# Patient Record
Sex: Female | Born: 1965 | Race: Black or African American | Hispanic: No | Marital: Married | Smoking: Former smoker
Health system: Southern US, Community
[De-identification: ages and names within clinical notes are randomized; demographics above are authoritative.]

## PROBLEM LIST (undated history)

## (undated) DIAGNOSIS — L0390 Cellulitis, unspecified: Secondary | ICD-10-CM

## (undated) HISTORY — PX: CERVICAL CONIZATION W/BX: SHX1330

## (undated) HISTORY — PX: ABDOMINAL HYSTERECTOMY: SHX81

---

## 2014-01-19 ENCOUNTER — Inpatient Hospital Stay (HOSPITAL_COMMUNITY)
Admission: EM | Admit: 2014-01-19 | Discharge: 2014-01-21 | DRG: 603 | Disposition: A | Discharge status: Home / Self Care | Payer: Self-pay | Attending: Internal Medicine | Admitting: Internal Medicine

## 2014-01-19 ENCOUNTER — Encounter (HOSPITAL_COMMUNITY): Payer: Self-pay | Admitting: Emergency Medicine

## 2014-01-19 DIAGNOSIS — L03119 Cellulitis of unspecified part of limb: Principal | ICD-10-CM

## 2014-01-19 DIAGNOSIS — Z87891 Personal history of nicotine dependence: Secondary | ICD-10-CM

## 2014-01-19 DIAGNOSIS — L02419 Cutaneous abscess of limb, unspecified: Principal | ICD-10-CM | POA: Diagnosis present

## 2014-01-19 DIAGNOSIS — Z8249 Family history of ischemic heart disease and other diseases of the circulatory system: Secondary | ICD-10-CM

## 2014-01-19 DIAGNOSIS — L0390 Cellulitis, unspecified: Secondary | ICD-10-CM | POA: Diagnosis present

## 2014-01-19 HISTORY — DX: Cellulitis, unspecified: L03.90

## 2014-01-19 LAB — CBC WITH DIFFERENTIAL/PLATELET
BASOS ABS: 0 10*3/uL (ref 0.0–0.1)
Basophils Relative: 0 % (ref 0–1)
EOS PCT: 2 % (ref 0–5)
Eosinophils Absolute: 0.2 10*3/uL (ref 0.0–0.7)
HEMATOCRIT: 38.1 % (ref 36.0–46.0)
HEMOGLOBIN: 12.5 g/dL (ref 12.0–15.0)
LYMPHS ABS: 1.5 10*3/uL (ref 0.7–4.0)
LYMPHS PCT: 14 % (ref 12–46)
MCH: 27.6 pg (ref 26.0–34.0)
MCHC: 32.8 g/dL (ref 30.0–36.0)
MCV: 84.1 fL (ref 78.0–100.0)
MONO ABS: 1 10*3/uL (ref 0.1–1.0)
MONOS PCT: 9 % (ref 3–12)
NEUTROS ABS: 8.3 10*3/uL — AB (ref 1.7–7.7)
Neutrophils Relative %: 75 % (ref 43–77)
Platelets: 271 10*3/uL (ref 150–400)
RBC: 4.53 MIL/uL (ref 3.87–5.11)
RDW: 13.9 % (ref 11.5–15.5)
WBC: 10.9 10*3/uL — AB (ref 4.0–10.5)

## 2014-01-19 LAB — COMPREHENSIVE METABOLIC PANEL
ALT: 31 U/L (ref 0–35)
AST: 27 U/L (ref 0–37)
Albumin: 4 g/dL (ref 3.5–5.2)
Alkaline Phosphatase: 90 U/L (ref 39–117)
BILIRUBIN TOTAL: 0.5 mg/dL (ref 0.3–1.2)
BUN: 8 mg/dL (ref 6–23)
CALCIUM: 9.4 mg/dL (ref 8.4–10.5)
CHLORIDE: 96 meq/L (ref 96–112)
CO2: 26 meq/L (ref 19–32)
Creatinine, Ser: 1 mg/dL (ref 0.50–1.10)
GFR calc Af Amer: 76 mL/min — ABNORMAL LOW (ref >90)
GFR, EST NON AFRICAN AMERICAN: 66 mL/min — AB (ref >90)
GLUCOSE: 130 mg/dL — AB (ref 70–99)
Potassium: 4.2 mEq/L (ref 3.7–5.3)
Sodium: 136 mEq/L — ABNORMAL LOW (ref 137–147)
Total Protein: 8.2 g/dL (ref 6.0–8.3)

## 2014-01-19 MED ORDER — SODIUM CHLORIDE 0.9 % IV BOLUS (SEPSIS)
1000.0000 mL | Freq: Once | INTRAVENOUS | Status: AC
  Administered 2014-01-19: 1000 mL via INTRAVENOUS

## 2014-01-19 MED ORDER — VANCOMYCIN HCL IN DEXTROSE 1-5 GM/200ML-% IV SOLN
1000.0000 mg | Freq: Once | INTRAVENOUS | Status: AC
  Administered 2014-01-19: 1000 mg via INTRAVENOUS
  Filled 2014-01-19: qty 200

## 2014-01-19 MED ORDER — OXYCODONE-ACETAMINOPHEN 5-325 MG PO TABS
1.0000 | ORAL_TABLET | Freq: Once | ORAL | Status: AC
  Administered 2014-01-19: 1 via ORAL
  Filled 2014-01-19: qty 1

## 2014-01-19 NOTE — ED Notes (Signed)
Had fever and chills yesterday.  Noticed swelling and redness of bilateral LE this morning.  Has had cellulitis before and states this is how it presented.Marland Kitchen

## 2014-01-19 NOTE — ED Provider Notes (Signed)
CSN: [id number]     Arrival date & time 01/19/14  1847 History  This chart was scribed for Benny Lennert, MD by Danella Maiers, ED Scribe. This patient was seen in room APA19/APA19 and the patient's care was started at 8:56 PM.    Chief Complaint  Patient presents with  . Leg Swelling  . Leg Pain   Patient is a 48 y.o. female presenting with leg pain. The history is provided by the patient. No language interpreter was used.  Leg Pain Location:  Leg Time since incident:  12 hours Leg location:  L lower leg and R lower leg Chronicity:  Recurrent Relieved by:  Nothing Worsened by:  Nothing tried Ineffective treatments:  None tried Associated symptoms: fever, itching and swelling   Associated symptoms: no back pain and no fatigue    HPI Comments: Bonnie Hayden is a 48 y.o. female who presents to the Emergency Department complaining of pain, itching, swelling, and redness to bilateral lower legs onset this morning after 2 days of fevers and chills. She has a h/o cellulitis that looked the same and occurred on both legs, 2 years ago and 6 years ago.  Past Medical History  Diagnosis Date  . Cellulitis    Past Surgical History  Procedure Laterality Date  . Cervical conization w/bx    . Cesarean section      x2  . Abdominal hysterectomy      partial   History reviewed. No pertinent family history. History  Substance Use Topics  . Smoking status: Former Games developer  . Smokeless tobacco: Not on file  . Alcohol Use: No   OB History   Grav Para Term Preterm Abortions TAB SAB Ect Mult Living                 Review of Systems  Constitutional: Positive for fever and chills. Negative for appetite change and fatigue.  HENT: Negative for congestion, ear discharge and sinus pressure.   Eyes: Negative for discharge.  Respiratory: Negative for cough.   Cardiovascular: Positive for leg swelling. Negative for chest pain.  Gastrointestinal: Negative for abdominal pain and diarrhea.   Genitourinary: Negative for frequency and hematuria.  Musculoskeletal: Negative for back pain.  Skin: Positive for color change, itching and rash.  Neurological: Negative for seizures and headaches.  Psychiatric/Behavioral: Negative for hallucinations.      Allergies  Review of patient's allergies indicates not on file.  Home Medications   Prior to Admission medications   Not on File   BP 135/76  Pulse 108  Temp(Src) 100 F (37.8 C) (Oral)  Resp 18  Ht 5' 5.5" (1.664 m)  Wt 255 lb (115.667 kg)  BMI 41.77 kg/m2  SpO2 100% Physical Exam  Constitutional: She is oriented to person, place, and time. She appears well-developed.  HENT:  Head: Normocephalic.  Eyes: Conjunctivae and EOM are normal. No scleral icterus.  Neck: Neck supple. No thyromegaly present.  Cardiovascular: Normal rate and regular rhythm.  Exam reveals no gallop and no friction rub.   No murmur heard. Pulmonary/Chest: No stridor. She has no wheezes. She has no rales. She exhibits no tenderness.  Abdominal: She exhibits no distension. There is no tenderness. There is no rebound.  Musculoskeletal: Normal range of motion. She exhibits no edema.  Swelling to BLE with rash and tenderness  Lymphadenopathy:    She has no cervical adenopathy.  Neurological: She is oriented to person, place, and time. She exhibits normal muscle tone. Coordination normal.  Skin: No rash noted. No erythema.  Psychiatric: She has a normal mood and affect. Her behavior is normal.    ED Course  Procedures (including critical care time) Medications  sodium chloride 0.9 % bolus 1,000 mL (not administered)  vancomycin (VANCOCIN) IVPB 1000 mg/200 mL premix (not administered)    DIAGNOSTIC STUDIES: Oxygen Saturation is 100% on RA, normal by my interpretation.    COORDINATION OF CARE: 9:01 PM- Discussed treatment plan with pt which includes basic labs. Will give IV fluids and vancomycin. Pt agrees to plan.    Labs Review Labs  Reviewed  CULTURE, BLOOD (ROUTINE X 2)  CULTURE, BLOOD (ROUTINE X 2)  CBC WITH DIFFERENTIAL  COMPREHENSIVE METABOLIC PANEL    Imaging Review No results found.   EKG Interpretation None      MDM   Final diagnoses:  None   The chart was scribed for me under my direct supervision.  I personally performed the history, physical, and medical decision making and all procedures in the evaluation of this patient.Benny Lennert.   Breniyah Romm L Mirha Brucato, MD 01/19/14 [phone]

## 2014-01-20 ENCOUNTER — Inpatient Hospital Stay (HOSPITAL_COMMUNITY): Payer: Self-pay

## 2014-01-20 DIAGNOSIS — L0291 Cutaneous abscess, unspecified: Secondary | ICD-10-CM

## 2014-01-20 DIAGNOSIS — L0390 Cellulitis, unspecified: Secondary | ICD-10-CM

## 2014-01-20 LAB — COMPREHENSIVE METABOLIC PANEL
ALT: 26 U/L (ref 0–35)
AST: 22 U/L (ref 0–37)
Albumin: 3.2 g/dL — ABNORMAL LOW (ref 3.5–5.2)
Alkaline Phosphatase: 73 U/L (ref 39–117)
BILIRUBIN TOTAL: 0.5 mg/dL (ref 0.3–1.2)
BUN: 7 mg/dL (ref 6–23)
CHLORIDE: 101 meq/L (ref 96–112)
CO2: 26 mEq/L (ref 19–32)
Calcium: 8.5 mg/dL (ref 8.4–10.5)
Creatinine, Ser: 0.99 mg/dL (ref 0.50–1.10)
GFR, EST AFRICAN AMERICAN: 77 mL/min — AB (ref >90)
GFR, EST NON AFRICAN AMERICAN: 66 mL/min — AB (ref >90)
GLUCOSE: 103 mg/dL — AB (ref 70–99)
Potassium: 4 mEq/L (ref 3.7–5.3)
SODIUM: 137 meq/L (ref 137–147)
Total Protein: 6.9 g/dL (ref 6.0–8.3)

## 2014-01-20 LAB — CBC
HCT: 34.7 % — ABNORMAL LOW (ref 36.0–46.0)
Hemoglobin: 11.4 g/dL — ABNORMAL LOW (ref 12.0–15.0)
MCH: 27.7 pg (ref 26.0–34.0)
MCHC: 32.9 g/dL (ref 30.0–36.0)
MCV: 84.4 fL (ref 78.0–100.0)
PLATELETS: 272 10*3/uL (ref 150–400)
RBC: 4.11 MIL/uL (ref 3.87–5.11)
RDW: 14 % (ref 11.5–15.5)
WBC: 8.7 10*3/uL (ref 4.0–10.5)

## 2014-01-20 MED ORDER — ACETAMINOPHEN 325 MG PO TABS
650.0000 mg | ORAL_TABLET | Freq: Four times a day (QID) | ORAL | Status: DC | PRN
  Administered 2014-01-20 – 2014-01-21 (×4): 650 mg via ORAL
  Filled 2014-01-20 (×4): qty 2

## 2014-01-20 MED ORDER — HYDROXYZINE HCL 25 MG PO TABS
25.0000 mg | ORAL_TABLET | Freq: Three times a day (TID) | ORAL | Status: DC | PRN
  Administered 2014-01-20: 25 mg via ORAL
  Filled 2014-01-20: qty 1

## 2014-01-20 MED ORDER — VANCOMYCIN HCL IN DEXTROSE 1-5 GM/200ML-% IV SOLN
1000.0000 mg | Freq: Two times a day (BID) | INTRAVENOUS | Status: DC
  Administered 2014-01-20 – 2014-01-21 (×3): 1000 mg via INTRAVENOUS
  Filled 2014-01-20 (×5): qty 200

## 2014-01-20 MED ORDER — VANCOMYCIN HCL 10 G IV SOLR
1500.0000 mg | Freq: Once | INTRAVENOUS | Status: AC
  Administered 2014-01-20: 1500 mg via INTRAVENOUS
  Filled 2014-01-20: qty 1500

## 2014-01-20 MED ORDER — ENOXAPARIN SODIUM 40 MG/0.4ML ~~LOC~~ SOLN
40.0000 mg | SUBCUTANEOUS | Status: DC
  Administered 2014-01-20 – 2014-01-21 (×2): 40 mg via SUBCUTANEOUS
  Filled 2014-01-20 (×2): qty 0.4

## 2014-01-20 MED ORDER — ONDANSETRON HCL 4 MG/2ML IJ SOLN: 4.0000 mg | Freq: Four times a day (QID) | INTRAMUSCULAR | Status: DC | PRN

## 2014-01-20 MED ORDER — MORPHINE SULFATE 2 MG/ML IJ SOLN: 2.0000 mg | INTRAMUSCULAR | Status: DC | PRN

## 2014-01-20 MED ORDER — SODIUM CHLORIDE 0.9 % IV SOLN
INTRAVENOUS | Status: DC
  Administered 2014-01-20 – 2014-01-21 (×3): via INTRAVENOUS

## 2014-01-20 MED ORDER — ONDANSETRON HCL 4 MG PO TABS: 4.0000 mg | ORAL_TABLET | Freq: Four times a day (QID) | ORAL | Status: DC | PRN

## 2014-01-20 NOTE — Progress Notes (Signed)
ANTIBIOTIC CONSULT NOTE-Preliminary  Pharmacy Consult for Vancomycin Indication: Cellulitis  Allergies  Allergen Reactions  . Penicillins Other (See Comments)    Unknown reaction  . Tramadol Nausea Only    Patient Measurements: Height: 5' 5.5" (166.4 cm) Weight: 255 lb (115.667 kg) IBW/kg (Calculated) : 58.15   Vital Signs: Temp: 100 F (37.8 C) (06/05 1905) Temp src: Oral (06/05 1905) BP: 135/76 mmHg (06/05 1905) Pulse Rate: 108 (06/05 1905)  Labs:  Recent Labs  01/19/14 2111  WBC 10.9*  HGB 12.5  PLT 271  CREATININE 1.00    Estimated Creatinine Clearance: 88.2 ml/min (by C-G formula based on Cr of 1).  No results found for this basename: VANCOTROUGH, VANCOPEAK, VANCORANDOM, GENTTROUGH, GENTPEAK, GENTRANDOM, TOBRATROUGH, TOBRAPEAK, TOBRARND, AMIKACINPEAK, AMIKACINTROU, AMIKACIN,  in the last 72 hours   Microbiology: No results found for this or any previous visit (from the past 720 hour(s)).  Medical History: Past Medical History  Diagnosis Date  . Cellulitis     Medications:  Vancomycin 1 Gm IV x 1 dose in the ED at 2200 01/19/14  Assessment: 48 yo female with PMH sig for 2 episodes of cellulitis admitted with bilateral leg swelling, redness, itching and 2 day hx of chills and fever. Vancomycin to be started for cellulitis.  Goal of Therapy:  Vancomycin troughs 10-15 mcg/ml  Plan:  Preliminary review of pertinent patient information completed.  Protocol will be initiated with a one-time dose of Vancomycin 1500 mg IV in addition to the 1 GM dose given in the ED.  Jeani Hawking clinical pharmacist will complete review during morning rounds to assess patient and finalize treatment regimen.  Arelia Sneddon, [facility] 01/20/2014,1:29 AM .

## 2014-01-20 NOTE — H&P (Signed)
PCP:   No PCP Per Patient   Chief Complaint:  Leg swelling and redness  HPI:  48 year old female who  has a past medical history of Cellulitis. today presents to the ED with chief complaint of leg pain, swelling and redness of both lower extremities for past 2 days associated with fever and chills, she denies injury or insect bite. She did have cellulitis diagnosed 2 years ago and 6 years ago with similar presentation involving both lower extremities. She also complains of pain involving both lower extremities. She denies nausea vomiting or diarrhea. No chest pain or shortness of breath.   Allergies:   Allergies  Allergen Reactions  . Penicillins Other (See Comments)    Unknown reaction  . Tramadol Nausea Only      Past Medical History  Diagnosis Date  . Cellulitis     Past Surgical History  Procedure Laterality Date  . Cervical conization w/bx    . Cesarean section      x2  . Abdominal hysterectomy      partial    Prior to Admission medications   Medication Sig Start Date End Date Taking? Authorizing Provider  acetaminophen (TYLENOL) 500 MG tablet Take 1,500 mg by mouth once as needed for mild pain or moderate pain.   Yes Historical Provider, MD    Social History:  reports that she has quit smoking. She does not have any smokeless tobacco history on file. She reports that she does not drink alcohol or use illicit drugs.  Patient's father died of MI at the age of 49  All the positives are listed in BOLD  Review of Systems:  HEENT: Headache, blurred vision, runny nose, sore throat Neck: Hypothyroidism, hyperthyroidism,,lymphadenopathy Chest : Shortness of breath, history of COPD, Asthma Heart : Chest pain, history of coronary arterey disease GI:  Nausea, vomiting, diarrhea, constipation, GERD GU: Dysuria, urgency, frequency of urination, hematuria Neuro: Stroke, seizures, syncope Psych: Depression, anxiety, hallucinations   Physical Exam: Blood pressure  135/76, pulse 108, temperature 100 F (37.8 C), temperature source Oral, resp. rate 18, height 5' 5.5" (1.664 m), weight 115.667 kg (255 lb), SpO2 100.00%. Constitutional:   Patient is a well-developed and well-nourished female* in no acute distress and cooperative with exam. Head: Normocephalic and atraumatic Mouth: Mucus membranes moist Eyes: PERRL, EOMI, conjunctivae normal Neck: Supple, No Thyromegaly Cardiovascular: RRR, S1 normal, S2 normal Pulmonary/Chest: CTAB, no wheezes, rales, or rhonchi Abdominal: Soft. Non-tender, non-distended, bowel sounds are normal, no masses, organomegaly, or guarding present.  Neurological: A&O x3, Strenght is normal and symmetric bilaterally, cranial nerve II-XII are grossly intact, no focal motor deficit, sensory intact to light touch bilaterally.  Extremities : Patchy erythema, warmth of both lower extremities, tenderness to palpation  Labs on Admission:  Basic Metabolic Panel:  Recent Labs Lab 01/19/14 2111  NA 136*  K 4.2  CL 96  CO2 26  GLUCOSE 130*  BUN 8  CREATININE 1.00  CALCIUM 9.4   Liver Function Tests:  Recent Labs Lab 01/19/14 2111  AST 27  ALT 31  ALKPHOS 90  BILITOT 0.5  PROT 8.2  ALBUMIN 4.0   No results found for this basename: LIPASE, AMYLASE,  in the last 168 hours No results found for this basename: AMMONIA,  in the last 168 hours CBC:  Recent Labs Lab 01/19/14 2111  WBC 10.9*  NEUTROABS 8.3*  HGB 12.5  HCT 38.1  MCV 84.1  PLT 271       Assessment/Plan Principal Problem:  Cellulitis  Cellulitis We'll admit the patient under observation, and start IV vancomycin. We'll give morphine when necessary for pain\ We'll also obtain bilateral venous duplex to rule out underlying DVT though less likely. Blood cultures x2 have been obtained, if patient clinically improves can be discharged in next 08-20-46 hours on by mouth antibiotics.   Code status: Presumed full code  Family discussion: Discussed with  patient's sister at bedside   Time Spent on Admission: 60 minutes  Meredeth Ide [facility] Pager: [phone] 01/20/2014, 12:12 AM  If 7PM-7AM, please contact night-coverage  [email or web address]  Password [email or web address]

## 2014-01-20 NOTE — Progress Notes (Signed)
ANTIBIOTIC CONSULT NOTE  Pharmacy Consult for Vancomycin Indication: cellulitis  Allergies  Allergen Reactions  . Penicillins Other (See Comments)    Unknown reaction  . Tramadol Nausea Only    Patient Measurements: Height: 5' 5.5" (166.4 cm) Weight: 253 lb 4.8 oz (114.896 kg) IBW/kg (Calculated) : 58.15  Vital Signs: Temp: 99.8 F (37.7 C) (06/06 0604) Temp src: Oral (06/06 0604) BP: 121/70 mmHg (06/06 0604) Pulse Rate: 90 (06/06 0604) Intake/Output from previous day: 06/05 0701 - 06/06 0700 In: 957.5 [P.O.:120; I.V.:337.5; IV Piggyback:500] Out: 700 [Urine:700] Intake/Output from this shift:    Labs:  Recent Labs  01/19/14 2111 01/20/14 0601  WBC 10.9* 8.7  HGB 12.5 11.4*  PLT 271 272  CREATININE 1.00 0.99   Estimated Creatinine Clearance: 88.8 ml/min (by C-G formula based on Cr of 0.99). No results found for this basename: VANCOTROUGH, Leodis Binet, VANCORANDOM, GENTTROUGH, GENTPEAK, GENTRANDOM, TOBRATROUGH, TOBRAPEAK, TOBRARND, AMIKACINPEAK, AMIKACINTROU, AMIKACIN,  in the last 72 hours   Microbiology: Recent Results (from the past 720 hour(s))  CULTURE, BLOOD (ROUTINE X 2)     Status: None   Collection Time    01/19/14  9:11 PM      Result Value Ref Range Status   Specimen Description RIGHT ANTECUBITAL   Final   Special Requests BOTTLES DRAWN AEROBIC AND ANAEROBIC 8CC   Final   Culture NO GROWTH 1 DAY   Final   Report Status PENDING   Incomplete  CULTURE, BLOOD (ROUTINE X 2)     Status: None   Collection Time    01/19/14  9:20 PM      Result Value Ref Range Status   Specimen Description BLOOD LEFT HAND   Final   Special Requests BOTTLES DRAWN AEROBIC AND ANAEROBIC 6CC   Final   Culture NO GROWTH 1 DAY   Final   Report Status PENDING   Incomplete    Anti-infectives   Start     Dose/Rate Route Frequency Ordered Stop   01/20/14 0130  vancomycin (VANCOCIN) 1,500 mg in sodium chloride 0.9 % 500 mL IVPB     1,500 mg 250 mL/hr over 120 Minutes  Intravenous  Once 01/20/14 0127 01/20/14 0421   01/19/14 2115  vancomycin (VANCOCIN) IVPB 1000 mg/200 mL premix     1,000 mg 200 mL/hr over 60 Minutes Intravenous  Once 01/19/14 2101 01/19/14 2320      Assessment: Okay for Protocol, obesity/Normalized CrCl dosing being treated for BLE cellulitis. Estimated normalized CrCl = 85ml/min.  Patient received a total loading dose of 2500mg .  Goal of Therapy:  Vancomycin trough level 10-15 mcg/ml  Plan:  Vancomycin 1000mg  IV every 12 hours. Measure antibiotic drug levels at steady state Follow up culture results  Mady Gemma 01/20/2014,7:39 AM

## 2014-01-20 NOTE — Progress Notes (Signed)
I have seen and assessed patient and agree with Dr Lama's assessment and plan. 

## 2014-01-21 LAB — URINALYSIS, ROUTINE W REFLEX MICROSCOPIC
BILIRUBIN URINE: NEGATIVE
GLUCOSE, UA: NEGATIVE mg/dL
Hgb urine dipstick: NEGATIVE
Ketones, ur: NEGATIVE mg/dL
Leukocytes, UA: NEGATIVE
Nitrite: NEGATIVE
PROTEIN: NEGATIVE mg/dL
Specific Gravity, Urine: >1.03 — ABNORMAL HIGH (ref 1.005–1.030)
UROBILINOGEN UA: 1 mg/dL (ref 0.0–1.0)
pH: 6 (ref 5.0–8.0)

## 2014-01-21 LAB — BASIC METABOLIC PANEL
BUN: 7 mg/dL (ref 6–23)
CALCIUM: 8.8 mg/dL (ref 8.4–10.5)
CO2: 26 meq/L (ref 19–32)
CREATININE: 0.99 mg/dL (ref 0.50–1.10)
Chloride: 104 mEq/L (ref 96–112)
GFR calc Af Amer: 77 mL/min — ABNORMAL LOW (ref >90)
GFR, EST NON AFRICAN AMERICAN: 66 mL/min — AB (ref >90)
GLUCOSE: 72 mg/dL (ref 70–99)
Potassium: 4 mEq/L (ref 3.7–5.3)
Sodium: 140 mEq/L (ref 137–147)

## 2014-01-21 LAB — CBC WITH DIFFERENTIAL/PLATELET
BASOS PCT: 0 % (ref 0–1)
Basophils Absolute: 0 10*3/uL (ref 0.0–0.1)
EOS ABS: 0.2 10*3/uL (ref 0.0–0.7)
Eosinophils Relative: 3 % (ref 0–5)
HCT: 33.3 % — ABNORMAL LOW (ref 36.0–46.0)
HEMOGLOBIN: 10.9 g/dL — AB (ref 12.0–15.0)
LYMPHS ABS: 2.4 10*3/uL (ref 0.7–4.0)
Lymphocytes Relative: 36 % (ref 12–46)
MCH: 27.9 pg (ref 26.0–34.0)
MCHC: 32.7 g/dL (ref 30.0–36.0)
MCV: 85.2 fL (ref 78.0–100.0)
MONOS PCT: 18 % — AB (ref 3–12)
Monocytes Absolute: 1.2 10*3/uL — ABNORMAL HIGH (ref 0.1–1.0)
NEUTROS ABS: 2.8 10*3/uL (ref 1.7–7.7)
Neutrophils Relative %: 43 % (ref 43–77)
PLATELETS: 254 10*3/uL (ref 150–400)
RBC: 3.91 MIL/uL (ref 3.87–5.11)
RDW: 14.3 % (ref 11.5–15.5)
WBC: 6.7 10*3/uL (ref 4.0–10.5)

## 2014-01-21 MED ORDER — DOXYCYCLINE HYCLATE 100 MG PO TABS: 100.0000 mg | ORAL_TABLET | Freq: Two times a day (BID) | ORAL | Status: AC

## 2014-01-21 MED ORDER — IBUPROFEN 600 MG PO TABS: 600.0000 mg | ORAL_TABLET | Freq: Four times a day (QID) | ORAL | Status: AC | PRN

## 2014-01-21 MED ORDER — CEPHALEXIN 500 MG PO CAPS: 500.0000 mg | ORAL_CAPSULE | Freq: Two times a day (BID) | ORAL | Status: AC

## 2014-01-21 MED ORDER — HYDROXYZINE HCL 25 MG PO TABS: 25.0000 mg | ORAL_TABLET | Freq: Three times a day (TID) | ORAL | Status: AC | PRN

## 2014-01-21 NOTE — Discharge Summary (Signed)
Physician Discharge Summary  Emi HolesLolita S Griffey BJY:[id number] DOB: 06/18/1966 DOA: 01/19/2014  PCP: No PCP Per Patient  Admit date: 01/19/2014 Discharge date: 01/21/2014  Time spent: 60 minutes  Recommendations for Outpatient Follow-up:  1. Followup with PCP one week post discharge.  Discharge Diagnoses:  Principal Problem:   Cellulitis   Discharge Condition: Stable and improved.  Diet recommendation: Regular  Filed Weights   01/19/14 1905 01/20/14 0130  Weight: 115.667 kg (255 lb) 114.896 kg (253 lb 4.8 oz)    History of present illness:  48 year old female who has a past medical history of Cellulitis. today presents to the ED with chief complaint of leg pain, swelling and redness of both lower extremities for past 2 days associated with fever and chills, she denies injury or insect bite. She did have cellulitis diagnosed 2 years ago and 6 years ago with similar presentation involving both lower extremities.  She also complains of pain involving both lower extremities.  She denies nausea vomiting or diarrhea. No chest pain or shortness of breath.   Hospital Course:  #1 bilateral lower extremity cellulitis Patient presented with complaints of leg pain swelling and redness in the bilateral lower extremities 2 days prior to admission with some associated fever and chills. Patient denies any injuries or insect bites. Patient was admitted to the hospital placed empirically on IV vancomycin, IV fluids and supportive care. Lower extremity Dopplers were done which were negative for DVT. Patient improved clinically and patient be discharged home on 6 days of oral doxycycline and oral Keflex to complete a one-week course of antibiotic therapy. Patient is to followup with PCP one week post discharge. Patient will be discharged in stable and improved condition.  Procedures:  Bilateral lower extremity Doppler 01/20/2014 negative for DVT  Consultations:  None  Discharge Exam: Filed Vitals:    01/21/14 0605  BP: 121/70  Pulse: 77  Temp: 98.2 F (36.8 C)  Resp: 18    General: NAD Cardiovascular: RRR Respiratory: CTAB  Discharge Instructions You were cared for by a hospitalist during your hospital stay. If you have any questions about your discharge medications or the care you received while you were in the hospital after you are discharged, you can call the unit and asked to speak with the hospitalist on call if the hospitalist that took care of you is not available. Once you are discharged, your primary care physician will handle any further medical issues. Please note that NO REFILLS for any discharge medications will be authorized once you are discharged, as it is imperative that you return to your primary care physician (or establish a relationship with a primary care physician if you do not have one) for your aftercare needs so that they can reassess your need for medications and monitor your lab values.      Discharge Instructions   Diet general    Complete by:  As directed      Discharge instructions    Complete by:  As directed   Follow up with PCP in 1 week.     Increase activity slowly    Complete by:  As directed             Medication List         acetaminophen 500 MG tablet  Commonly known as:  TYLENOL  Take 1,500 mg by mouth once as needed for mild pain or moderate pain.     cephALEXin 500 MG capsule  Commonly known as:  KEFLEX  Take  1 capsule (500 mg total) by mouth 2 (two) times daily. TAKE FOR 6 DAYS THEN STOP.     doxycycline 100 MG tablet  Commonly known as:  VIBRA-TABS  Take 1 tablet (100 mg total) by mouth 2 (two) times daily. TAKE FOR 6 DAYS THEN STOP.     hydrOXYzine 25 MG tablet  Commonly known as:  ATARAX/VISTARIL  Take 1 tablet (25 mg total) by mouth 3 (three) times daily as needed for itching.     ibuprofen 600 MG tablet  Commonly known as:  ADVIL,MOTRIN  Take 1 tablet (600 mg total) by mouth every 6 (six) hours as needed.        Allergies  Allergen Reactions  . Penicillins Other (See Comments)    Unknown reaction  . Tramadol Nausea Only   Follow-up Information   Schedule an appointment as soon as possible for a visit in 1 week to follow up. (f/u with PCP in 1 week.)        The results of significant diagnostics from this hospitalization (including imaging, microbiology, ancillary and laboratory) are listed below for reference.    Significant Diagnostic Studies: US Venous Img Lower Bilateral  01/20/2014   CLINICAL DATA:  Bilateral leg pain, evaluate for DVT  EXAM: BILATERAL LOWER EXTREMITY VENOUS DOPPLER ULTRASOUND  TECHNIQUE: Gray-scale sonography with graded compression, as well as color Doppler and duplex ultrasound were performed to evaluate the lower extremity deep venous systems from the level of the common femoral vein and including the common femoral, femoral, profunda femoral, popliteal and calf veins including the posterior tibial, peroneal and gastrocnemius veins when visible. The superficial great saphenous vein was also interrogated. Spectral Doppler was utilized to evaluate flow at rest and with distal augmentation maneuvers in the common femoral, femoral and popliteal veins.  COMPARISON:  None.  FINDINGS: RIGHT LOWER EXTREMITY  Visualized right lower extremity deep venous system appears patent.  Normal compressibility. Patent color Doppler flow. Satisfactory spectral Doppler with respiratory variation and response to augmentation.  Greater saphenous vein, where visualized, is patent and compressible.  LEFT LOWER EXTREMITY  Visualized left lower extremity deep venous system appears patent.  Normal compressibility. Patent color Doppler flow. Satisfactory spectral Doppler with respiratory variation and response to augmentation.  Greater saphenous vein, where visualized, is patent and compressible.  IMPRESSION: No deep venous thrombosis in the visualized bilateral lower extremities.   Electronically Signed   By:  Charline Bills M.D.   On: 01/20/2014 12:20    Microbiology: Recent Results (from the past 240 hour(s))  CULTURE, BLOOD (ROUTINE X 2)     Status: None   Collection Time    01/19/14  9:11 PM      Result Value Ref Range Status   Specimen Description RIGHT ANTECUBITAL   Final   Special Requests BOTTLES DRAWN AEROBIC AND ANAEROBIC 8CC   Final   Culture NO GROWTH 2 DAYS   Final   Report Status PENDING   Incomplete  CULTURE, BLOOD (ROUTINE X 2)     Status: None   Collection Time    01/19/14  9:20 PM      Result Value Ref Range Status   Specimen Description BLOOD LEFT HAND   Final   Special Requests BOTTLES DRAWN AEROBIC AND ANAEROBIC 6CC   Final   Culture NO GROWTH 2 DAYS   Final   Report Status PENDING   Incomplete     Labs: Basic Metabolic Panel:  Recent Labs Lab 01/19/14 2111 01/20/14 0601 01/21/14 7035  NA 136* 137 140  K 4.2 4.0 4.0  CL 96 101 104  CO2 26 26 26   GLUCOSE 130* 103* 72  BUN 8 7 7   CREATININE 1.00 0.99 0.99  CALCIUM 9.4 8.5 8.8   Liver Function Tests:  Recent Labs Lab 01/19/14 2111 01/20/14 0601  AST 27 22  ALT 31 26  ALKPHOS 90 73  BILITOT 0.5 0.5  PROT 8.2 6.9  ALBUMIN 4.0 3.2*   No results found for this basename: LIPASE, AMYLASE,  in the last 168 hours No results found for this basename: AMMONIA,  in the last 168 hours CBC:  Recent Labs Lab 01/19/14 2111 01/20/14 0601 01/21/14 0551  WBC 10.9* 8.7 6.7  NEUTROABS 8.3*  --  2.8  HGB 12.5 11.4* 10.9*  HCT 38.1 34.7* 33.3*  MCV 84.1 84.4 85.2  PLT 271 272 254   Cardiac Enzymes: No results found for this basename: CKTOTAL, CKMB, CKMBINDEX, TROPONINI,  in the last 168 hours BNP: BNP (last 3 results) No results found for this basename: PROBNP,  in the last 8760 hours CBG: No results found for this basename: GLUCAP,  in the last 168 hours     Signed:  Ramiro Harvest V  [facility] 01/21/2014, 11:58 AM

## 2014-01-21 NOTE — Discharge Instructions (Signed)
Cellulitis °Cellulitis is an infection of the skin and the tissue under the skin. The infected area is usually red and tender. This happens most often in the arms and lower legs. °HOME CARE  °· Take your antibiotic medicine as told. Finish the medicine even if you start to feel better. °· Keep the infected arm or leg raised (elevated). °· Put a warm cloth on the area up to 4 times per day. °· Only take medicines as told by your doctor. °· Keep all doctor visits as told. °GET HELP RIGHT AWAY IF:  °· You have a fever. °· You feel very sleepy. °· You throw up (vomit) or have watery poop (diarrhea). °· You feel sick and have muscle aches and pains. °· You see red streaks on the skin coming from the infected area. °· Your red area gets bigger or turns a dark color. °· Your bone or joint under the infected area is painful after the skin heals. °· Your infection comes back in the same area or different area. °· You have a puffy (swollen) bump in the infected area. °· You have new symptoms. °MAKE SURE YOU:  °· Understand these instructions. °· Will watch your condition. °· Will get help right away if you are not doing well or get worse. °Document Released: 01/20/2008 Document Revised: 02/02/2012 Document Reviewed: 10/19/2011 °[employer]® Patient Information ©2014 [employer]. ° °

## 2014-01-21 NOTE — Progress Notes (Signed)
01/21/14 1345 Patient left floor in stable condition accompanied by nurse tech. Earnstine Regal, RN

## 2014-01-21 NOTE — Progress Notes (Signed)
01/21/14 1322 Reviewed discharge instructions with patient. Given copy of AVS, prescriptions, f/u appointment information. Pt states no PCP but usually goes to [facility] urgent care office for appointments. Provided contact information for [facility] and [facility] as well. States will call to make appointment for f/u. States no insurance but will be able to obtain her medications. Reviewed cellulitis education sheet, signs and symptoms, when to seek medical attention. Verbalized understanding. IV site d/c'd, within normal limits. No c/o pain or discomfort at this time. Pt in stable condition awaiting family arrival for discharge home. Earnstine Regal, RN

## 2014-01-22 LAB — URINE CULTURE
Colony Count: NO GROWTH
Culture: NO GROWTH

## 2014-01-24 LAB — CULTURE, BLOOD (ROUTINE X 2)
Culture: NO GROWTH
Culture: NO GROWTH

## 2014-01-30 NOTE — Care Management Utilization Note (Signed)
UR completed 

## 2015-03-24 IMAGING — US US EXTREM LOW VENOUS BILAT
1 series · 13 of 24 positions shown · non-contrast
Comparison: None.

CLINICAL DATA: Bilateral leg pain, evaluate for DVT



[Series 1: us extrem low venous bilat · 0.06mm/px · 13 of 70 slices shown]
[im 1/70]
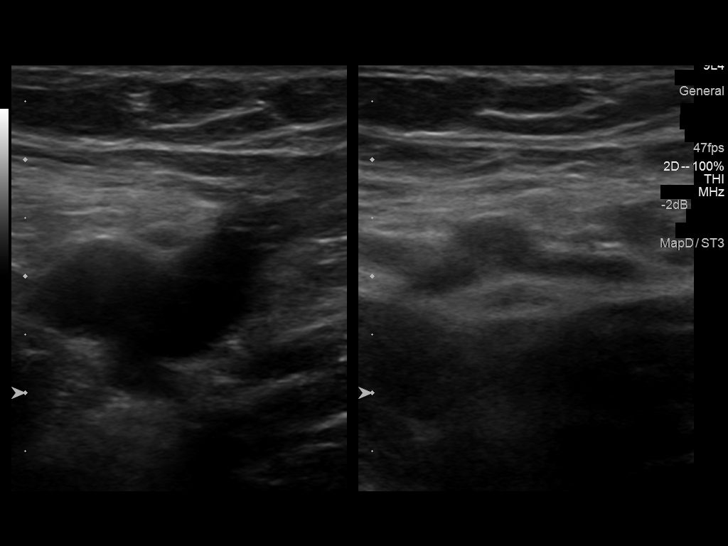
[im 7/70]
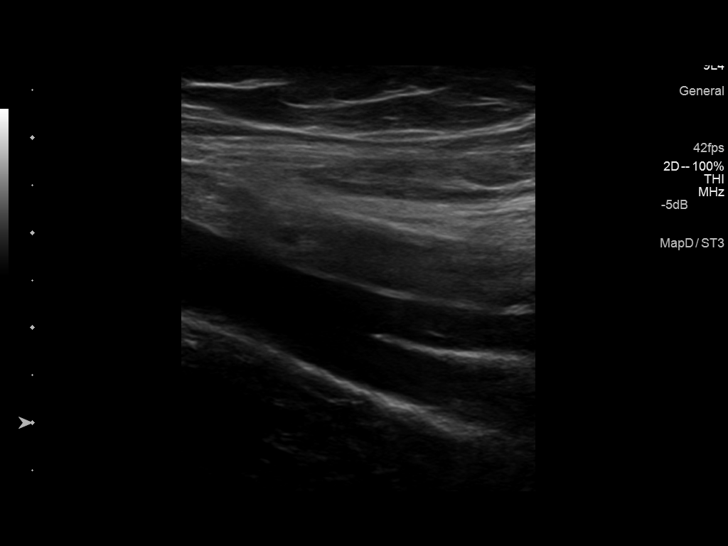
[im 13/70]
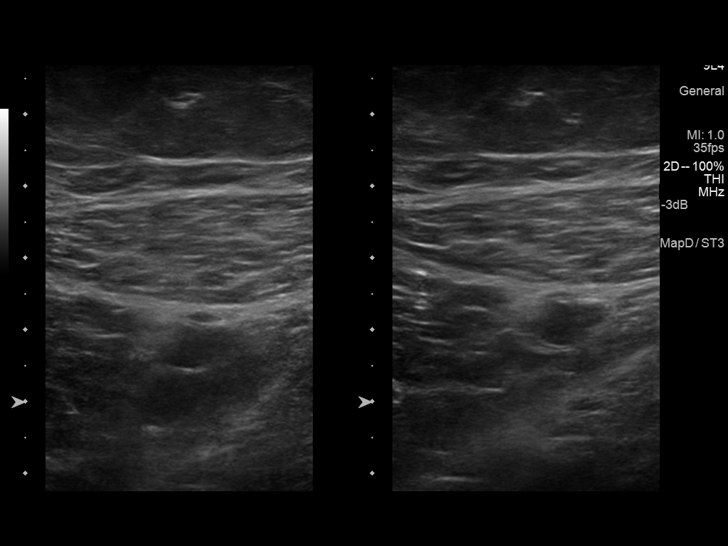
[im 19/70]
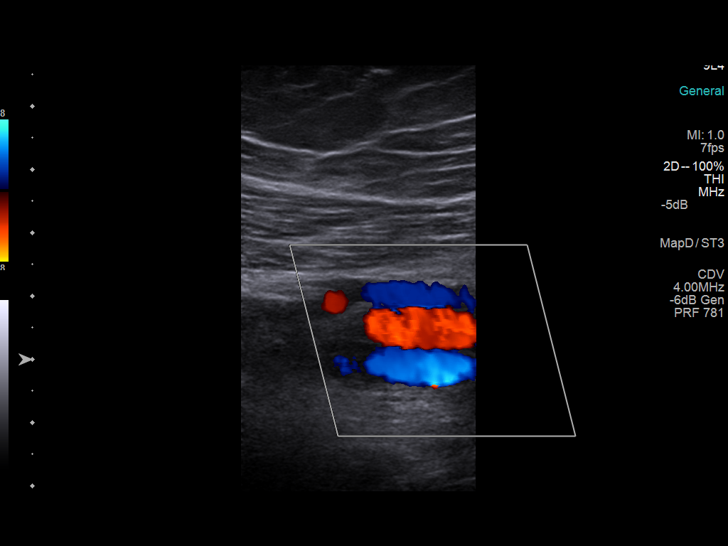
[im 25/70]
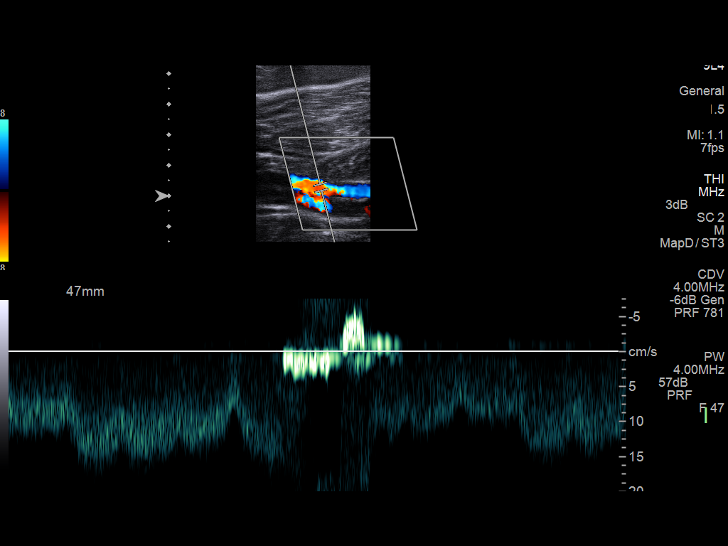
[im 31/70]
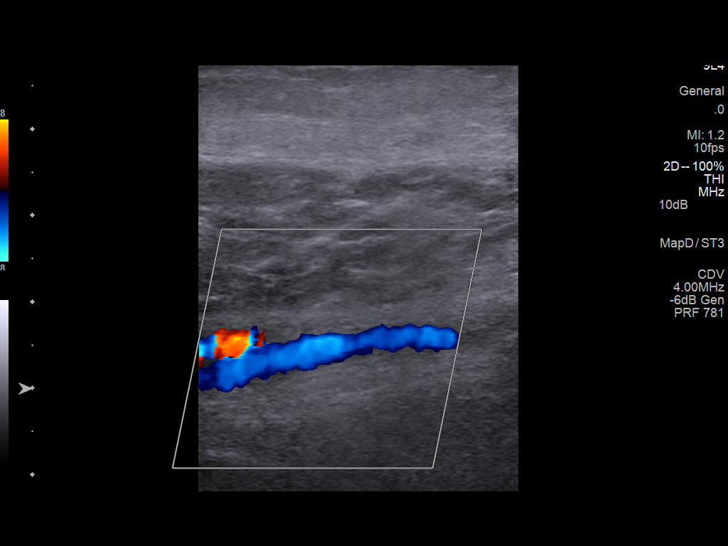
[im 37/70]
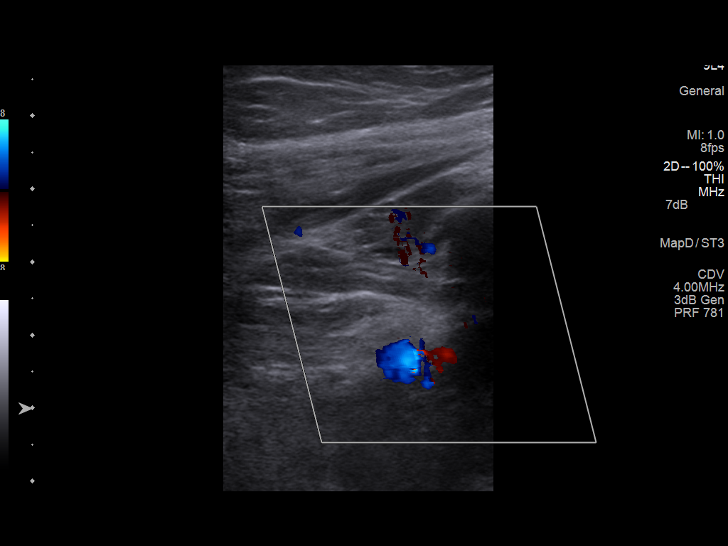
[im 40/70]
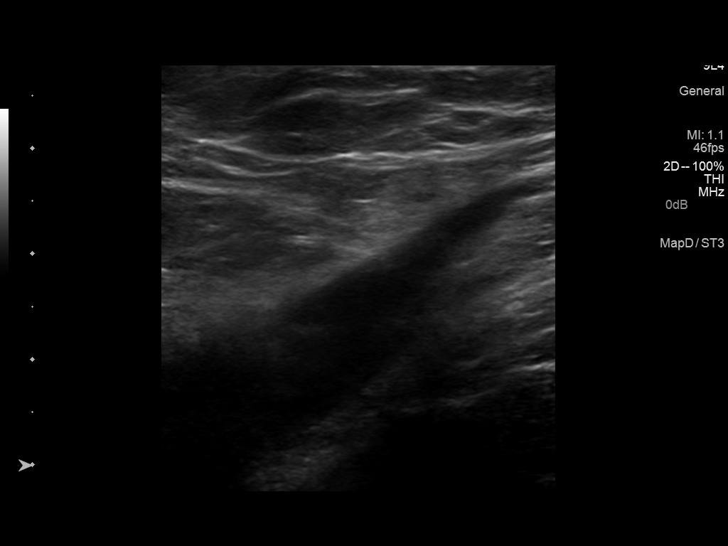
[im 46/70]
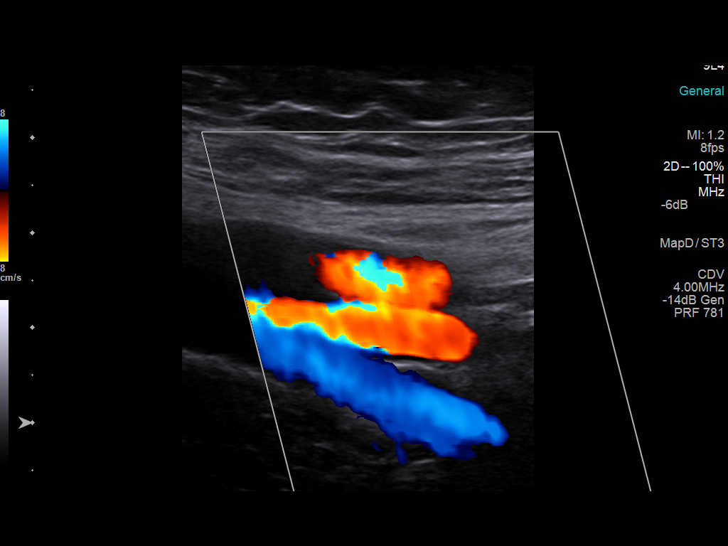
[im 52/70]
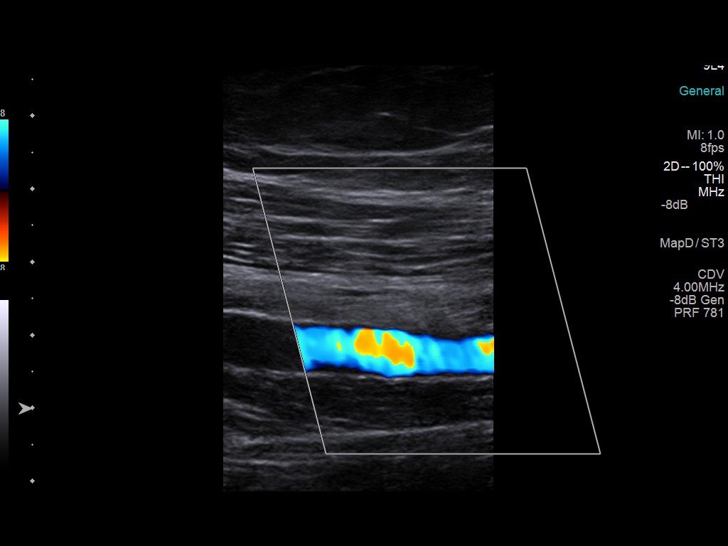
[im 58/70]
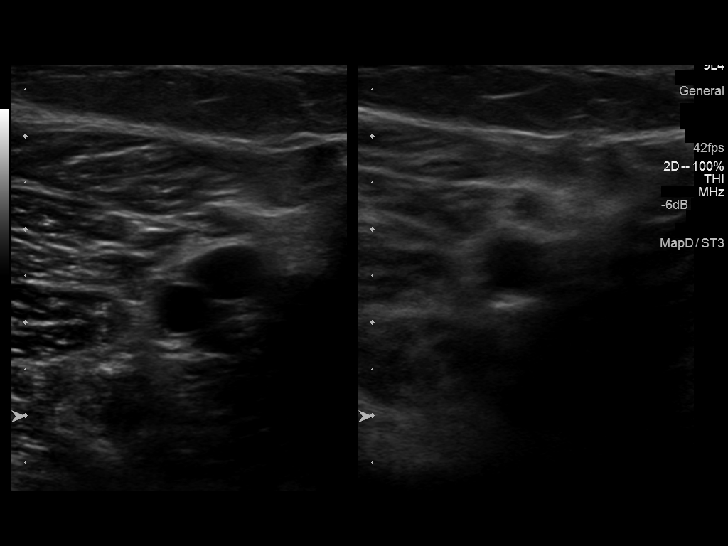
[im 64/70]
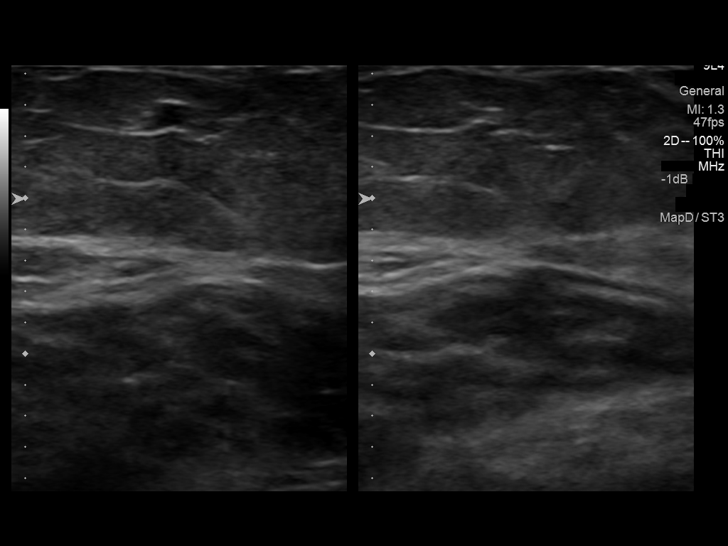
[im 70/70]
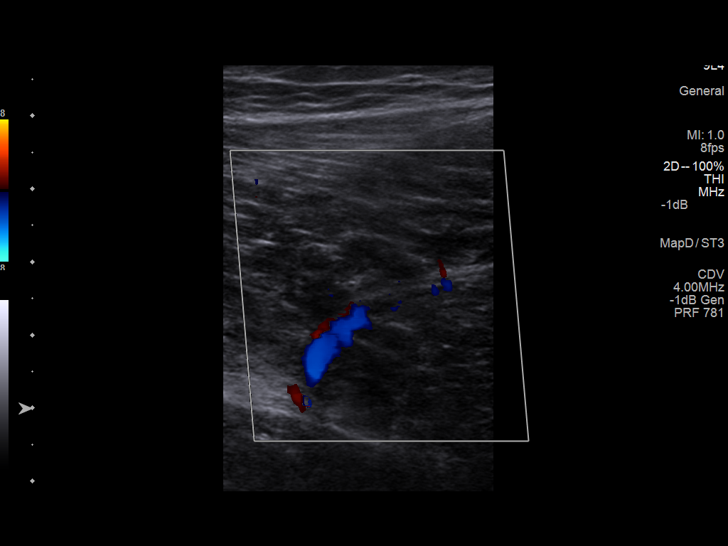

[13 of 24 positions shown; findings below may reference images not displayed]

FINDINGS: RIGHT LOWER EXTREMITY

Visualized right lower extremity deep venous system appears patent.

Normal compressibility. Patent color Doppler flow. Satisfactory
spectral Doppler with respiratory variation and response to
augmentation.

Greater saphenous vein, where visualized, is patent and
compressible.

LEFT LOWER EXTREMITY

Visualized left lower extremity deep venous system appears patent.

Normal compressibility. Patent color Doppler flow. Satisfactory
spectral Doppler with respiratory variation and response to
augmentation.

Greater saphenous vein, where visualized, is patent and
compressible.
IMPRESSION: No deep venous thrombosis in the visualized bilateral lower
extremities.
